# Patient Record
Sex: Male | Born: 1975 | Race: White | Hispanic: No | Marital: Single | Smoking: Current every day smoker
Health system: Southern US, Community
[De-identification: ages and names within clinical notes are randomized; demographics above are authoritative.]

## PROBLEM LIST (undated history)

## (undated) HISTORY — PX: APPENDECTOMY: SHX54

---

## 2004-04-20 ENCOUNTER — Emergency Department (HOSPITAL_COMMUNITY): Admission: EM | Admit: 2004-04-20 | Discharge: 2004-04-20 | Payer: Self-pay | Admitting: Emergency Medicine

## 2007-02-05 ENCOUNTER — Emergency Department (HOSPITAL_COMMUNITY): Admission: EM | Admit: 2007-02-05 | Discharge: 2007-02-05 | Payer: Self-pay | Admitting: *Deleted

## 2007-02-10 ENCOUNTER — Emergency Department (HOSPITAL_COMMUNITY): Admission: EM | Admit: 2007-02-10 | Discharge: 2007-02-10 | Payer: Self-pay | Admitting: Emergency Medicine

## 2011-05-18 ENCOUNTER — Emergency Department (HOSPITAL_COMMUNITY)
Admission: EM | Admit: 2011-05-18 | Discharge: 2011-05-18 | Disposition: A | Discharge status: Home / Self Care | Payer: Self-pay | Attending: Emergency Medicine | Admitting: Emergency Medicine

## 2011-05-18 DIAGNOSIS — F17200 Nicotine dependence, unspecified, uncomplicated: Secondary | ICD-10-CM | POA: Insufficient documentation

## 2011-05-18 DIAGNOSIS — M62838 Other muscle spasm: Secondary | ICD-10-CM | POA: Insufficient documentation

## 2011-05-18 DIAGNOSIS — M549 Dorsalgia, unspecified: Secondary | ICD-10-CM | POA: Insufficient documentation

## 2012-07-22 ENCOUNTER — Encounter (HOSPITAL_COMMUNITY): Payer: Self-pay | Admitting: Emergency Medicine

## 2012-07-22 ENCOUNTER — Emergency Department (HOSPITAL_COMMUNITY): Payer: Self-pay

## 2012-07-22 ENCOUNTER — Emergency Department (HOSPITAL_COMMUNITY)
Admission: EM | Admit: 2012-07-22 | Discharge: 2012-07-22 | Disposition: A | Discharge status: Home / Self Care | Payer: Self-pay | Attending: Emergency Medicine | Admitting: Emergency Medicine

## 2012-07-22 DIAGNOSIS — X500XXA Overexertion from strenuous movement or load, initial encounter: Secondary | ICD-10-CM | POA: Insufficient documentation

## 2012-07-22 DIAGNOSIS — F17200 Nicotine dependence, unspecified, uncomplicated: Secondary | ICD-10-CM | POA: Insufficient documentation

## 2012-07-22 DIAGNOSIS — S93401A Sprain of unspecified ligament of right ankle, initial encounter: Secondary | ICD-10-CM

## 2012-07-22 DIAGNOSIS — S93409A Sprain of unspecified ligament of unspecified ankle, initial encounter: Secondary | ICD-10-CM | POA: Insufficient documentation

## 2012-07-22 MED ORDER — HYDROCODONE-ACETAMINOPHEN 5-325 MG PO TABS: 1.0000 | ORAL_TABLET | ORAL | Status: DC | PRN

## 2012-07-22 NOTE — ED Provider Notes (Signed)
History     CSN: [id number]  Arrival date & time 07/22/12  1710   First MD Initiated Contact with Patient 07/22/12 2002      Chief Complaint  Patient presents with  . Ankle Pain    (Consider location/radiation/quality/duration/timing/severity/associated sxs/prior treatment) HPI History provided by pt.   Pt stepped on a pole with his right foot 3 days ago and rolled his ankle, causing him to fall to the ground.  Did not hit head and denies neck/back pain or pain anywhere other than right ankle.  Is able to bear weight but aggravates pain.  Associated w/ ecchymosis as well as tingling of foot.  Has been icing, elevating, wearing a brace, using crutches and taking ibuprofen but continues to have pain.  He would just like to know if it is broken.    History reviewed. No pertinent past medical history.  Past Surgical History  Procedure Date  . Appendectomy     No family history on file.  History  Substance Use Topics  . Smoking status: Current Every Day Smoker -- 1.0 packs/day    Types: Cigarettes  . Smokeless tobacco: Not on file  . Alcohol Use: No      Review of Systems  All other systems reviewed and are negative.    Allergies  Review of patient's allergies indicates no known allergies.  Home Medications   Current Outpatient Rx  Name Route Sig Dispense Refill  . IBUPROFEN 200 MG PO TABS Oral Take 600 mg by mouth every 6 (six) hours as needed. For pain.    Marland Kitchen HYDROCODONE-ACETAMINOPHEN 5-325 MG PO TABS Oral Take 1 tablet by mouth every 4 (four) hours as needed for pain. 8 tablet 0    BP 127/78  Pulse 83  Temp 98.8 F (37.1 C) (Oral)  Resp 20  SpO2 99%  Physical Exam  Nursing note and vitals reviewed. Constitutional: He is oriented to person, place, and time. He appears well-developed and well-nourished. No distress.  HENT:  Head: Normocephalic and atraumatic.  Eyes:       Normal appearance  Neck: Normal range of motion.  Pulmonary/Chest: Effort normal.    Musculoskeletal: Normal range of motion.       Ecchymosis and minimal edema inferior to lateral malleolus.  Mild tenderness both medial and lateral malleolus, but particularly over lateral ligaments.  Mild pain w/ passive ROM of ankle.  2+ DP pulse and distal sensation intact.    Neurological: He is alert and oriented to person, place, and time.  Psychiatric: He has a normal mood and affect. His behavior is normal.    ED Course  Procedures (including critical care time)  Labs Reviewed - No data to display Dg Ankle Complete Right  07/22/2012  *RADIOLOGY REPORT*  Clinical Data: Right ankle pain, rolled ankle, bruising  RIGHT ANKLE - COMPLETE 3+ VIEW  Comparison: None.  Findings: Three views of the right ankle submitted.  No acute fracture or subluxation.  No radiopaque foreign body.  Ankle mortise is preserved.  IMPRESSION: No acute fracture or subluxation.   Original Report Authenticated By: Natasha Mead, M.D.      1. Sprain of right ankle       MDM  36yo healthy M had an inversion injury to right ankle 3 days ago.  Ecchymosis and tenderness most prominent inferior to lateral malleolus on exam.  Xray neg for fx/dislocation.  Will continue to treat symptomatically for sprain.  Prescribed 8 hydrocodone.  He already has crutches and ASO.  Referred to ortho for persistent pain and/or joint laxity.  8:16 PM        Otilio Miu, [place] 07/22/12 2016

## 2012-07-22 NOTE — ED Notes (Addendum)
Patient c/o right ankle pain after injuring right ankle on Tuesday.  CMS intact. Patient denies any other issues.

## 2012-07-23 NOTE — ED Provider Notes (Signed)
Medical screening examination/treatment/procedure(s) were performed by non-physician practitioner and as supervising physician I was immediately available for consultation/collaboration.  Brayden Brodhead R. Johnathan Heskett, MD 07/23/12 0055 

## 2012-09-19 ENCOUNTER — Emergency Department (HOSPITAL_COMMUNITY)
Admission: EM | Admit: 2012-09-19 | Discharge: 2012-09-19 | Disposition: A | Discharge status: Home / Self Care | Payer: Self-pay | Attending: Emergency Medicine | Admitting: Emergency Medicine

## 2012-09-19 ENCOUNTER — Emergency Department (HOSPITAL_COMMUNITY): Payer: Self-pay

## 2012-09-19 ENCOUNTER — Encounter (HOSPITAL_COMMUNITY): Payer: Self-pay | Admitting: Emergency Medicine

## 2012-09-19 DIAGNOSIS — R070 Pain in throat: Secondary | ICD-10-CM | POA: Insufficient documentation

## 2012-09-19 DIAGNOSIS — R1310 Dysphagia, unspecified: Secondary | ICD-10-CM | POA: Insufficient documentation

## 2012-09-19 DIAGNOSIS — F17200 Nicotine dependence, unspecified, uncomplicated: Secondary | ICD-10-CM | POA: Insufficient documentation

## 2012-09-19 MED ORDER — LIDOCAINE VISCOUS 2 % MT SOLN: 20.0000 mL | OROMUCOSAL | Status: DC | PRN

## 2012-09-19 NOTE — ED Notes (Signed)
Patient transported to X-ray 

## 2012-09-19 NOTE — ED Notes (Signed)
Last night he was eating and felt like he had something  Stuck in throat had a fever last night and has not been able to eat but can drink. Pt breathing well handling [place] well no drooling

## 2012-09-19 NOTE — ED Provider Notes (Signed)
History     CSN: [id number]  Arrival date & time 09/19/12  1422   First MD Initiated Contact with Patient 09/19/12 1519      No chief complaint on file.   (Consider location/radiation/quality/duration/timing/severity/associated sxs/prior treatment) HPI Comments: 36 year old male presents to the emergency department complaining of something stuck in his throat after eating dinner last night. He was eating cubed steak with gravy and mashed potatoes when he felt a "hot sharp" feeling as he was swallowing. He has not been able to eat anything since, however he is able to drink. Denies pain when he doesn't fall, pain increasing to 10 out of 10 with swallowing. Denies difficulty breathing or choking.  The history is provided by the patient.    History reviewed. No pertinent past medical history.  Past Surgical History  Procedure Date  . Appendectomy     No family history on file.  History  Substance Use Topics  . Smoking status: Current Every Day Smoker -- 1.0 packs/day    Types: Cigarettes  . Smokeless tobacco: Not on file  . Alcohol Use: No      Review of Systems  Constitutional: Negative for activity change.  HENT: Positive for sore throat and trouble swallowing. Negative for voice change.   Respiratory: Negative for apnea, choking and shortness of breath.   Neurological: Negative for speech difficulty.    Allergies  Review of patient's allergies indicates no known allergies.  Home Medications   Current Outpatient Rx  Name  Route  Sig  Dispense  Refill  . VITAMIN C 1000 MG PO TABS   Oral   Take 1,000 mg by mouth daily.           BP 121/71  Pulse 83  Temp 98.4 F (36.9 C)  Resp 16  SpO2 100%  Physical Exam  Constitutional: He is oriented to person, place, and time. He appears well-developed and well-nourished. No distress.  HENT:  Head: Normocephalic and atraumatic.  Mouth/Throat: Uvula is midline. Posterior oropharyngeal erythema present. No  posterior oropharyngeal edema.  Eyes: Conjunctivae normal and EOM are normal. Pupils are equal, round, and reactive to light.  Neck: Normal range of motion and phonation normal. Neck supple. No tracheal deviation present.    Cardiovascular: Normal rate, regular rhythm and normal heart sounds.   Pulmonary/Chest: Effort normal and breath sounds normal. No stridor. No respiratory distress.  Abdominal: Soft. Bowel sounds are normal.  Musculoskeletal: Normal range of motion. He exhibits no edema.  Neurological: He is alert and oriented to person, place, and time.  Skin: Skin is warm and dry.  Psychiatric: He has a normal mood and affect. His behavior is normal.    ED Course  Procedures (including critical care time)  Labs Reviewed - No data to display Dg Neck Soft Tissue  09/19/2012  *RADIOLOGY REPORT*  Clinical Data: Sensation of foreign body in the throat.  NECK SOFT TISSUES - 1+ VIEW  Comparison: None.  Findings: There is some fullness posterior to the larynx.  No radiopaque foreign body is evident.  The soft tissues are otherwise unremarkable.  The airway is patent.  There is straightening of the normal cervical lordosis. The lung apices are clear.  IMPRESSION:  1.  Slight fullness posterior to the larynx without any radiopaque foreign body. 2.  The airway is preserved.   Original Report Authenticated By: Marin Roberts, M.D.    Dg Esophagus  09/19/2012  *RADIOLOGY REPORT*  Clinical Data: 36 year old male with pain and difficulty swallowing  solids following the medial left night.  Able to swallow liquids. Possible food impaction.  ESOPHOGRAM/BARIUM SWALLOW  Technique:  Single contrast examination was performed using thin and thick barium plus a barium tablet.  Fluoroscopy time:  2.0 minutes.  Comparison:  Neck radiograph from the same day.  Findings:  The patient was able to swallow thin and thick barium without difficulty.  No obstruction to the forward flow of barium throughout the  cervical and thoracic esophagus and into the stomach.  Prompt gastric emptying to the duodenum.  No foreign body or impacted material identified within the esophagus.  There is smooth narrowing of the esophagus at the level of the aortic impression (series 2, series 14, series 15).  A 12.5 mm barium tablet was briefly lodged in this area.  The patient was mildly symptomatic.  The tablet passed with additional swallows of barium (series 23 - 29). There is subtle mucosal contour irregularity of the barium at the level (series 17, 18).  There is no contrast extravasation.  IMPRESSION: No lodged or impacted material identified within the esophagus. Smooth narrowing of the thoracic esophagus at the level of the aortic impression where there is mild mucosal irregularity.  The 12.5 mm barium tablet was arrested here for a short time (passed with additional swallows of barium). I suspect this represents focal mucosal injury with edema/swelling and subsequent narrowing of the lumen.  If the patient's symptoms do not resolve, recommend direct visualization.   Original Report Authenticated By: Erskine Speed, M.D.      1. Painful swallowing       MDM  36 y/o male with dysphasia. Patient able to drink but not eat. [facility] recommended barium swallow which showed smooth narrowing of thoracic esophagus. GI states this should resolve on its own in 2-3 days. Advised viscous lidocaine and lots of fluids. If no improvement in 2-3 days patient will f/u with GI. Patient states his understanding of plan and is agreeable.        Trevor Mace, PA-C 09/19/12 1924

## 2012-09-19 NOTE — ED Notes (Signed)
Discharged home.  Patient voiced understanding of foods to eat, drinks, etc

## 2012-09-19 NOTE — ED Provider Notes (Addendum)
Chief complaint : Feels something stuck in throat  Feels something is stuck in his throat (points to the sternal notch)since he ate cube steak mashed potatoes and gravy last night. He is able to drink however he states he tried to swallow pill and could not. He denies shortness of breath. The pain feels sharp as if it were a bone or metal object however he states he did not eat any bones. Patient alert no distress handling secretions well  Doug Sou, MD 09/19/12 1610  Doug Sou, MD 09/23/12 [phone]

## 2012-09-23 NOTE — ED Provider Notes (Signed)
Medical screening examination/treatment/procedure(s) were conducted as a shared visit with non-physician practitioner(s) and myself.  I personally evaluated the patient during the encounter  Doug Sou, MD 09/23/12 [phone]

## 2013-02-06 ENCOUNTER — Emergency Department (HOSPITAL_COMMUNITY)
Admission: EM | Admit: 2013-02-06 | Discharge: 2013-02-06 | Disposition: A | Discharge status: Home / Self Care | Payer: Self-pay | Attending: Emergency Medicine | Admitting: Emergency Medicine

## 2013-02-06 ENCOUNTER — Encounter (HOSPITAL_COMMUNITY): Payer: Self-pay | Admitting: Cardiology

## 2013-02-06 ENCOUNTER — Emergency Department (HOSPITAL_COMMUNITY): Payer: Self-pay

## 2013-02-06 DIAGNOSIS — S8990XA Unspecified injury of unspecified lower leg, initial encounter: Secondary | ICD-10-CM | POA: Insufficient documentation

## 2013-02-06 DIAGNOSIS — IMO0002 Reserved for concepts with insufficient information to code with codable children: Secondary | ICD-10-CM | POA: Insufficient documentation

## 2013-02-06 DIAGNOSIS — F17200 Nicotine dependence, unspecified, uncomplicated: Secondary | ICD-10-CM | POA: Insufficient documentation

## 2013-02-06 DIAGNOSIS — Y9301 Activity, walking, marching and hiking: Secondary | ICD-10-CM | POA: Insufficient documentation

## 2013-02-06 DIAGNOSIS — M79671 Pain in right foot: Secondary | ICD-10-CM

## 2013-02-06 DIAGNOSIS — Z79899 Other long term (current) drug therapy: Secondary | ICD-10-CM | POA: Insufficient documentation

## 2013-02-06 DIAGNOSIS — Z9889 Other specified postprocedural states: Secondary | ICD-10-CM | POA: Insufficient documentation

## 2013-02-06 DIAGNOSIS — Z8739 Personal history of other diseases of the musculoskeletal system and connective tissue: Secondary | ICD-10-CM | POA: Insufficient documentation

## 2013-02-06 DIAGNOSIS — Y92009 Unspecified place in unspecified non-institutional (private) residence as the place of occurrence of the external cause: Secondary | ICD-10-CM | POA: Insufficient documentation

## 2013-02-06 MED ORDER — HYDROCODONE-ACETAMINOPHEN 5-325 MG PO TABS: 1.0000 | ORAL_TABLET | Freq: Three times a day (TID) | ORAL | Status: AC | PRN

## 2013-02-06 NOTE — ED Notes (Signed)
Pt reports he hit his foot walking through his house yesterday. Reports pain to the right foot and swelling.

## 2013-02-06 NOTE — ED Provider Notes (Signed)
History  This chart was scribed for Gerhard Munch, MD by Ardeen Jourdain, ED Scribe. This patient was seen in room [facility] and the patient's care was started at 2100.  CSN: [id number]  Arrival date & time 02/06/13  1825   None     Chief Complaint  Patient presents with  . Foot Pain     The history is provided by the patient. No language interpreter was used.    Collin Stone is a 37 y.o. male who presents to the Emergency Department complaining of sudden onset, gradually worsening, constant right foot pain with associated swelling. Pt states he thinks hit his right foot when he was walking through his house yesterday. He states he has a h/o bursitis and ankle surgery. He states his ankle pain has been aggravated over the past few days as well. He locates the pain to the right side of his right foot. He denies any weakness, numbness, tingling and back pain as associated symptoms. Pt is able to ambulate.    History reviewed. No pertinent past medical history.  Past Surgical History  Procedure Laterality Date  . Appendectomy      No family history on file.  History  Substance Use Topics  . Smoking status: Current Every Day Smoker -- 1.00 packs/day    Types: Cigarettes  . Smokeless tobacco: Not on file  . Alcohol Use: Yes      Review of Systems  Constitutional:       Per HPI, otherwise negative  HENT:       Per HPI, otherwise negative  Respiratory:       Per HPI, otherwise negative  Cardiovascular:       Per HPI, otherwise negative  Gastrointestinal: Negative for vomiting.  Endocrine:       Negative aside from HPI  Genitourinary:       Neg aside from HPI   Musculoskeletal:       Per HPI, otherwise negative  Skin: Negative.   Neurological: Negative for syncope.  All other systems reviewed and are negative.    Allergies  Review of patient's allergies indicates no known allergies.  Home Medications   Current Outpatient Rx  Name  Route  Sig  Dispense   Refill  . Ascorbic Acid (VITAMIN C) 1000 MG tablet   Oral   Take 1,000 mg by mouth daily.         . Cyclobenzaprine HCl (FLEXERIL PO)   Oral   Take 1 tablet by mouth once.         Marland Kitchen HYDROcodone-acetaminophen (NORCO) 10-325 MG per tablet   Oral   Take 1 tablet by mouth once.           Triage Vitals: BP 111/68  Pulse 70  Temp(Src) 98.4 F (36.9 C) (Oral)  Resp 12  SpO2 98%  Physical Exam  Nursing note and vitals reviewed. Constitutional: He is oriented to person, place, and time. He appears well-developed. No distress.  HENT:  Head: Normocephalic and atraumatic.  Eyes: Conjunctivae and EOM are normal.  Cardiovascular: Normal rate and regular rhythm.   Pulmonary/Chest: Effort normal. No stridor. No respiratory distress.  Abdominal: He exhibits no distension.  Musculoskeletal: He exhibits no edema.  No medial/lateral ankle pain. Negative thompson's sign  Neurological: He is alert and oriented to person, place, and time.  Skin: Skin is warm and dry.  Psychiatric: He has a normal mood and affect.    ED Course  Procedures (including critical care time)  9:17  PM-Discussed treatment plan which includes x-ray of the right foot, pain medication and home care with pt at bedside and pt agreed to plan.    Labs Reviewed - No data to display Dg Foot Complete Right  02/06/2013  *RADIOLOGY REPORT*  Clinical Data:  Lateral mid foot pain after kicking a wall  RIGHT FOOT COMPLETE - 3+ VIEW  Comparison: Right ankle radiographs - 07/22/2012  Findings:  No fracture or dislocation.  Joint spaces are preserved.  No definite erosions.  Incidental note is made of an os peroneus and os trigonum.  Regional soft tissues are normal.  No radiopaque foreign body.  IMPRESSION:  No fracture or dislocation.   Original Report Authenticated By: Tacey Ruiz, MD    is reviewed and interpreted the patient's x-ray.   No diagnosis found.    MDM   I personally performed the services described in  this documentation, which was scribed in my presence. The recorded information has been reviewed and is accurate.  The patient presents after minor trauma to his foot.  He is neurologically intact, with no evidence of fracture on x-ray.  I reviewed the patient's [facility] prescription drug database information, though no recent prescriptions. He was  discharged with crutches, analgesics and orthopedics follow up.   Gerhard Munch, MD 02/06/13 2220

## 2013-09-28 IMAGING — CR DG FOOT COMPLETE 3+V*R*
3 series · 3 of 3 positions shown · non-contrast
Comparison: Right ankle radiographs - 07/22/2012

CLINICAL DATA: Lateral mid foot pain after kicking a wall

RIGHT FOOT COMPLETE - 3+ VIEW

[t foot ap right]
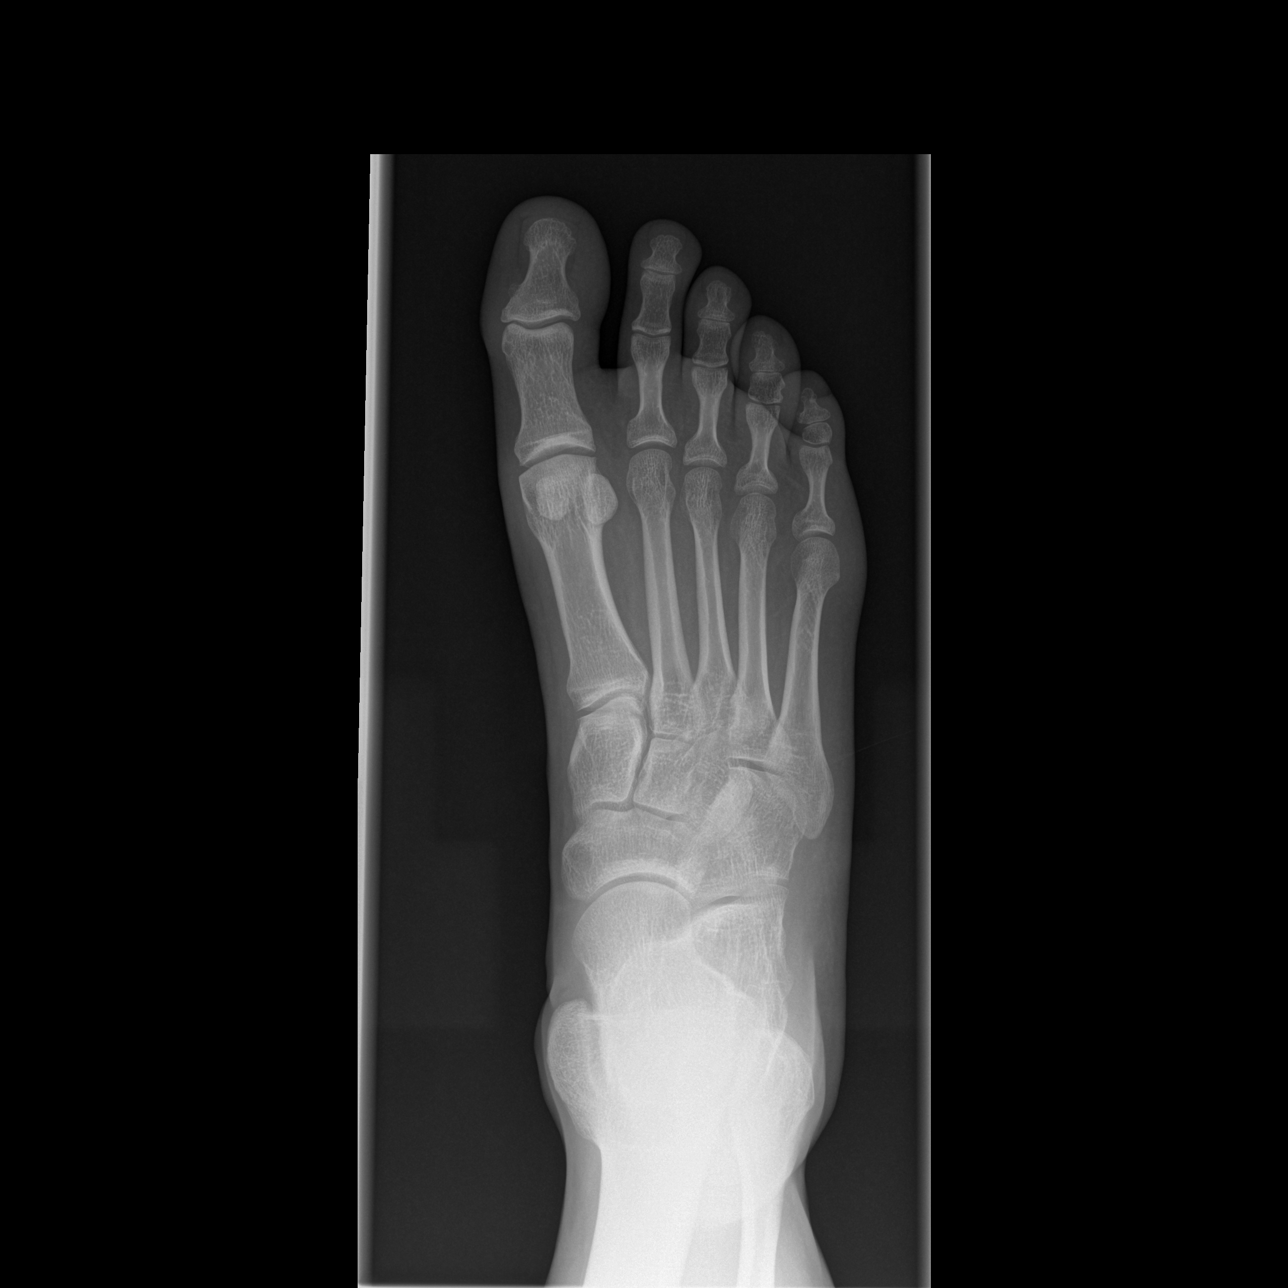

[t foot oblique right]
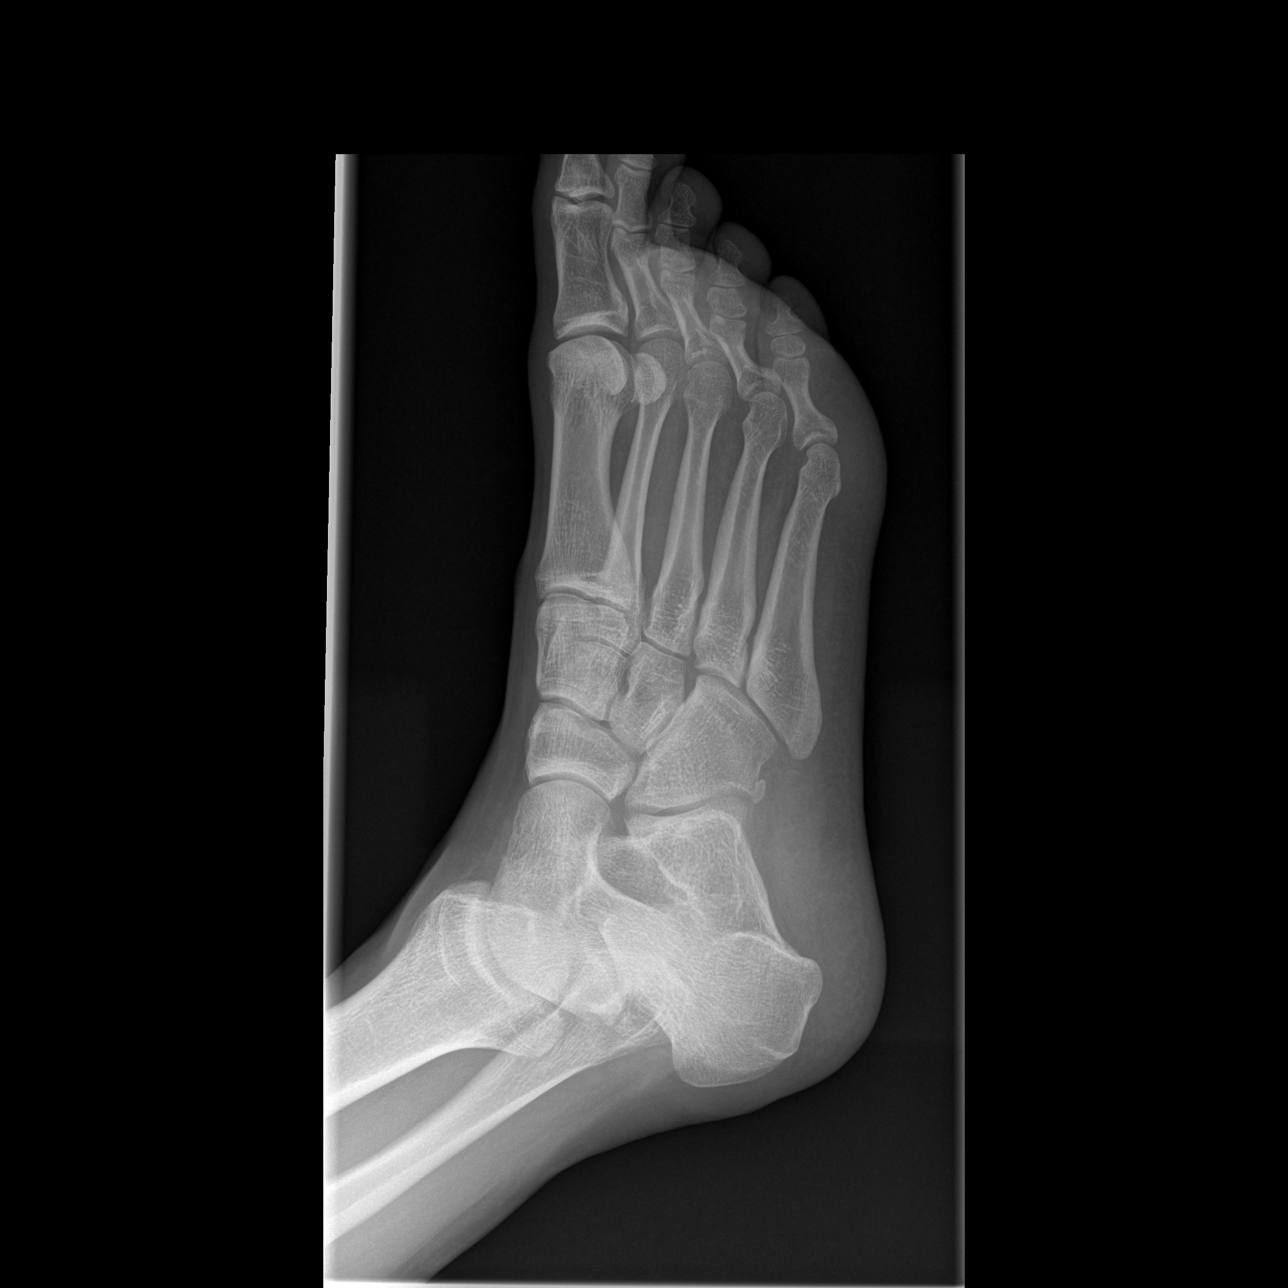

[t foot lat right]
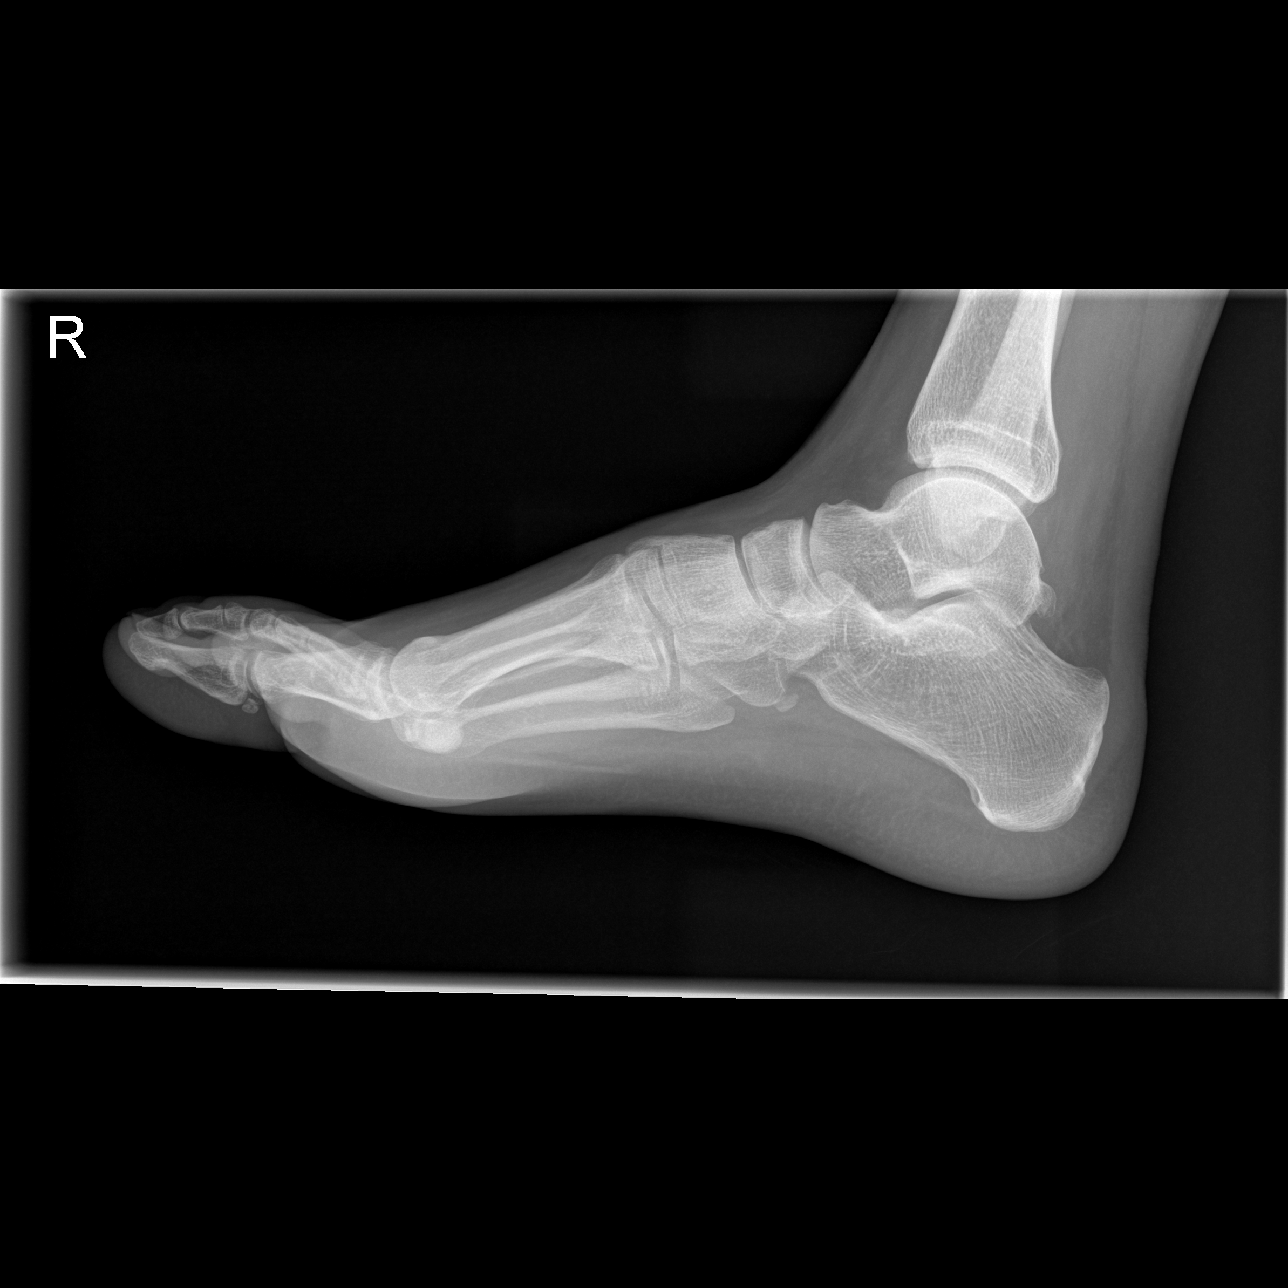

[3 of 3 positions shown; findings below may reference images not displayed]

FINDINGS: No fracture or dislocation.  Joint spaces are preserved.  No
definite erosions.  Incidental note is made of an os peroneus and
os trigonum.  Regional soft tissues are normal.  No radiopaque
foreign body.
IMPRESSION: No fracture or dislocation.

## 2014-03-03 ENCOUNTER — Emergency Department (HOSPITAL_COMMUNITY)
Admission: EM | Admit: 2014-03-03 | Discharge: 2014-03-03 | Disposition: A | Discharge status: Home / Self Care | Payer: Self-pay | Attending: Emergency Medicine | Admitting: Emergency Medicine

## 2014-03-03 ENCOUNTER — Encounter (HOSPITAL_COMMUNITY): Payer: Self-pay | Admitting: Emergency Medicine

## 2014-03-03 DIAGNOSIS — F17200 Nicotine dependence, unspecified, uncomplicated: Secondary | ICD-10-CM | POA: Insufficient documentation

## 2014-03-03 DIAGNOSIS — L255 Unspecified contact dermatitis due to plants, except food: Secondary | ICD-10-CM | POA: Insufficient documentation

## 2014-03-03 DIAGNOSIS — Z79899 Other long term (current) drug therapy: Secondary | ICD-10-CM | POA: Insufficient documentation

## 2014-03-03 DIAGNOSIS — IMO0002 Reserved for concepts with insufficient information to code with codable children: Secondary | ICD-10-CM | POA: Insufficient documentation

## 2014-03-03 MED ORDER — PREDNISONE 20 MG PO TABS: 40.0000 mg | ORAL_TABLET | Freq: Every day | ORAL | Status: AC

## 2014-03-03 MED ORDER — HYDROCORTISONE 2.5 % EX CREA: TOPICAL_CREAM | Freq: Two times a day (BID) | CUTANEOUS | Status: AC

## 2014-03-03 NOTE — ED Notes (Signed)
Pt c/o rash to bilateral arms and legs.  Thinks it might be poison oak because he was cleaning off a fence.  Rash x 3 days.

## 2014-03-03 NOTE — ED Provider Notes (Signed)
CSN: [id number]     Arrival date & time 03/03/14  1550 History  This chart was scribed for Junius Finner, PA-C  working with Dagmar Hait, MD by Ashley Jacobs, ED scribe. This patient was seen in room WTR9/WTR9 and the patient's care was started at 4:14 PM.  First MD Initiated Contact with Patient 03/03/14 1603     Chief Complaint  Patient presents with  . Rash     (Consider location/radiation/quality/duration/timing/severity/associated sxs/prior Treatment) Patient is a 38 y.o. male presenting with rash. The history is provided by the patient and medical records. No language interpreter was used.  Rash Location:  Leg, shoulder/arm and torso Shoulder/arm rash location:  L arm and R arm Leg rash location:  L leg and R leg Severity:  Moderate Onset quality:  Sudden Duration:  2 days Timing:  Constant Progression:  Spreading Chronicity:  New Context: plant contact   Relieved by:  Nothing Worsened by:  Nothing tried Ineffective treatments:  None tried Associated symptoms: no fever, no nausea and not vomiting    HPI Comments: Collin Stone is a 38 y.o. male who presents to the Emergency Department complaining of rash to her bilateral arms, chest and legs, onset three days. Pt thinks he may came into contact with poison oak while cleaning off a fence. The site itches and is worse at night. He tried OTC hydrocortisone which did not seem to help.  Pt had prior contatct poison oak and mentions that his symptoms are similar in nature. Denies fever, nausea and vomiting.  History reviewed. No pertinent past medical history. Past Surgical History  Procedure Laterality Date  . Appendectomy     History reviewed. No pertinent family history. History  Substance Use Topics  . Smoking status: Current Every Day Smoker -- 1.00 packs/day    Types: Cigarettes  . Smokeless tobacco: Not on file  . Alcohol Use: Yes    Review of Systems  Constitutional: Negative for fever.   Gastrointestinal: Negative for nausea and vomiting.  Skin: Positive for color change and rash.  All other systems reviewed and are negative.     Allergies  Review of patient's allergies indicates no known allergies.  Home Medications   Prior to Admission medications   Medication Sig Start Date End Date Taking? Authorizing Provider  Ascorbic Acid (VITAMIN C) 1000 MG tablet Take 1,000 mg by mouth daily.    Historical Provider, MD  Cyclobenzaprine HCl (FLEXERIL PO) Take 1 tablet by mouth once.    Historical Provider, MD  HYDROcodone-acetaminophen (NORCO) 10-325 MG per tablet Take 1 tablet by mouth once.    Historical Provider, MD  HYDROcodone-acetaminophen (NORCO/VICODIN) 5-325 MG per tablet Take 1 tablet by mouth every 8 (eight) hours as needed for pain. 02/06/13   Gerhard Munch, MD  hydrocortisone 2.5 % cream Apply topically 2 (two) times daily. 03/03/14   Junius Finner, PA-C  predniSONE (DELTASONE) 20 MG tablet Take 2 tablets (40 mg total) by mouth daily. 03/03/14   Junius Finner, PA-C   BP 139/78  Pulse 89  Temp(Src) 98.3 F (36.8 C) (Oral)  Resp 18  SpO2 100% Physical Exam  Nursing note and vitals reviewed. Constitutional: He is oriented to person, place, and time. He appears well-developed and well-nourished.  HENT:  Head: Normocephalic and atraumatic.  Eyes: EOM are normal.  Neck: Normal range of motion.  Cardiovascular: Normal rate.   Pulmonary/Chest: Effort normal.  Musculoskeletal: Normal range of motion.  Neurological: He is alert and oriented to person, place, and  time.  Skin: Skin is warm and dry. Rash noted. Rash is vesicular. There is erythema.  Diffuse erythematous vesicular rash with excoriation on left forearm, left lower leg, right leg, right arm and torso.  Psychiatric: He has a normal mood and affect. His behavior is normal.    ED Course  Procedures (including critical care time) DIAGNOSTIC STUDIES: Oxygen Saturation is 100% on room air, normal by my  interpretation.    COORDINATION OF CARE:  4:17 PM Discussed course of care with pt which includes deltasone 20 mg and hydrocortisone cream . Pt understands and agrees.   Labs Review Labs Reviewed - No data to display  Imaging Review No results found.   EKG Interpretation None      MDM   Final diagnoses:  Contact dermatitis due to plant   Pt presenting with diffuse rash consistent with contact dermatitis.  Will tx with prednisone and hydrocortisone cream. Advised to f/u with PCP as needed. Return precautions provided. Pt verbalized understanding and agreement with tx plan.   I personally performed the services described in this documentation, which was scribed in my presence. The recorded information has been reviewed and is accurate.      Junius Finnerrin O'Malley, PA-C 03/03/14 1635

## 2014-03-03 NOTE — ED Provider Notes (Signed)
Medical screening examination/treatment/procedure(s) were performed by non-physician practitioner and as supervising physician I was immediately available for consultation/collaboration.   EKG Interpretation None        Dagmar Hait, MD 03/03/14 [phone]

## 2014-11-25 ENCOUNTER — Encounter (HOSPITAL_COMMUNITY): Payer: Self-pay | Admitting: *Deleted

## 2014-11-25 ENCOUNTER — Emergency Department (HOSPITAL_COMMUNITY)
Admission: EM | Admit: 2014-11-25 | Discharge: 2014-11-25 | Disposition: A | Discharge status: Home / Self Care | Payer: Self-pay | Attending: Emergency Medicine | Admitting: Emergency Medicine

## 2014-11-25 DIAGNOSIS — S61219A Laceration without foreign body of unspecified finger without damage to nail, initial encounter: Secondary | ICD-10-CM

## 2014-11-25 DIAGNOSIS — Y998 Other external cause status: Secondary | ICD-10-CM | POA: Insufficient documentation

## 2014-11-25 DIAGNOSIS — Y9289 Other specified places as the place of occurrence of the external cause: Secondary | ICD-10-CM | POA: Insufficient documentation

## 2014-11-25 DIAGNOSIS — Z79899 Other long term (current) drug therapy: Secondary | ICD-10-CM | POA: Insufficient documentation

## 2014-11-25 DIAGNOSIS — S61213A Laceration without foreign body of left middle finger without damage to nail, initial encounter: Secondary | ICD-10-CM | POA: Insufficient documentation

## 2014-11-25 DIAGNOSIS — Z720 Tobacco use: Secondary | ICD-10-CM | POA: Insufficient documentation

## 2014-11-25 DIAGNOSIS — Y288XXA Contact with other sharp object, undetermined intent, initial encounter: Secondary | ICD-10-CM | POA: Insufficient documentation

## 2014-11-25 DIAGNOSIS — Y9389 Activity, other specified: Secondary | ICD-10-CM | POA: Insufficient documentation

## 2014-11-25 MED ORDER — SULFAMETHOXAZOLE-TRIMETHOPRIM 800-160 MG PO TABS: 1.0000 | ORAL_TABLET | Freq: Two times a day (BID) | ORAL | Status: AC

## 2014-11-25 MED ORDER — CEPHALEXIN 500 MG PO CAPS: 500.0000 mg | ORAL_CAPSULE | Freq: Four times a day (QID) | ORAL | Status: AC

## 2014-11-25 MED ORDER — LIDOCAINE HCL (PF) 1 % IJ SOLN
10.0000 mL | Freq: Once | INTRAMUSCULAR | Status: AC
  Administered 2014-11-25: 10 mL via INTRADERMAL
  Filled 2014-11-25: qty 10

## 2014-11-25 NOTE — ED Notes (Signed)
Suture cart at bedside 

## 2014-11-25 NOTE — ED Notes (Signed)
The pt has a laceration to the lt middle finger 4 hours ago on a razor blade while working on his car.  Bleeding controlled

## 2014-11-25 NOTE — Discharge Instructions (Signed)
We saw you in the ER for your WOUND. Please read the instructions provided on wound care. Keep the area clean and dry, apply bacitracin ointment daily and take the medications provided. RETURN TO THE ER IF THERE IS INCREASED PAIN, REDNESS, PUS COMING OUT from the wound site.   Laceration Care, Adult A laceration is a cut or lesion that goes through all layers of the skin and into the tissue just beneath the skin. TREATMENT  Some lacerations may not require closure. Some lacerations may not be able to be closed due to an increased risk of infection. It is important to see your caregiver as soon as possible after an injury to minimize the risk of infection and maximize the opportunity for successful closure. If closure is appropriate, pain medicines may be given, if needed. The wound will be cleaned to help prevent infection. Your caregiver will use stitches (sutures), staples, wound glue (adhesive), or skin adhesive strips to repair the laceration. These tools bring the skin edges together to allow for faster healing and a better cosmetic outcome. However, all wounds will heal with a scar. Once the wound has healed, scarring can be minimized by covering the wound with sunscreen during the day for 1 full year. HOME CARE INSTRUCTIONS  For sutures or staples:  Keep the wound clean and dry.  If you were given a bandage (dressing), you should change it at least once a day. Also, change the dressing if it becomes wet or dirty, or as directed by your caregiver.  Wash the wound with soap and water 2 times a day. Rinse the wound off with water to remove all soap. Pat the wound dry with a clean towel.  After cleaning, apply a thin layer of the antibiotic ointment as recommended by your caregiver. This will help prevent infection and keep the dressing from sticking.  You may shower as usual after the first 24 hours. Do not soak the wound in water until the sutures are removed.  Only take over-the-counter  or prescription medicines for pain, discomfort, or fever as directed by your caregiver.  Get your sutures or staples removed as directed by your caregiver. For skin adhesive strips:  Keep the wound clean and dry.  Do not get the skin adhesive strips wet. You may bathe carefully, using caution to keep the wound dry.  If the wound gets wet, pat it dry with a clean towel.  Skin adhesive strips will fall off on their own. You may trim the strips as the wound heals. Do not remove skin adhesive strips that are still stuck to the wound. They will fall off in time. For wound adhesive:  You may briefly wet your wound in the shower or bath. Do not soak or scrub the wound. Do not swim. Avoid periods of heavy perspiration until the skin adhesive has fallen off on its own. After showering or bathing, gently pat the wound dry with a clean towel.  Do not apply liquid medicine, cream medicine, or ointment medicine to your wound while the skin adhesive is in place. This may loosen the film before your wound is healed.  If a dressing is placed over the wound, be careful not to apply tape directly over the skin adhesive. This may cause the adhesive to be pulled off before the wound is healed.  Avoid prolonged exposure to sunlight or tanning lamps while the skin adhesive is in place. Exposure to ultraviolet light in the first year will darken the scar.  The skin adhesive will usually remain in place for 5 to 10 days, then naturally fall off the skin. Do not pick at the adhesive film. You may need a tetanus shot if:  You cannot remember when you had your last tetanus shot.  You have never had a tetanus shot. If you get a tetanus shot, your arm may swell, get red, and feel warm to the touch. This is common and not a problem. If you need a tetanus shot and you choose not to have one, there is a rare chance of getting tetanus. Sickness from tetanus can be serious. SEEK MEDICAL CARE IF:   You have redness,  swelling, or increasing pain in the wound.  You see a red line that goes away from the wound.  You have yellowish-white fluid (pus) coming from the wound.  You have a fever.  You notice a bad smell coming from the wound or dressing.  Your wound breaks open before or after sutures have been removed.  You notice something coming out of the wound such as wood or glass.  Your wound is on your hand or foot and you cannot move a finger or toe. SEEK IMMEDIATE MEDICAL CARE IF:   Your pain is not controlled with prescribed medicine.  You have severe swelling around the wound causing pain and numbness or a change in color in your arm, hand, leg, or foot.  Your wound splits open and starts bleeding.  You have worsening numbness, weakness, or loss of function of any joint around or beyond the wound.  You develop painful lumps near the wound or on the skin anywhere on your body. MAKE SURE YOU:   Understand these instructions.  Will watch your condition.  Will get help right away if you are not doing well or get worse. Document Released: 09/21/2005 Document Revised: 12/14/2011 Document Reviewed: 03/17/2011 [facility] Patient Information 2015 [place], [place]. This information is not intended to replace advice given to you by your health care provider. Make sure you discuss any questions you have with your health care provider.  Stitches, Staples, or Skin Adhesive Strips  Stitches (sutures), staples, and skin adhesive strips hold the skin together as it heals. They will usually be in place for 7 days or less. HOME CARE  Wash your hands with soap and water before and after you touch your wound.  Only take medicine as told by your doctor.  Cover your wound only if your doctor told you to. Otherwise, leave it open to air.  Do not get your stitches wet or dirty. If they get dirty, dab them gently with a clean washcloth. Wet the washcloth with soapy water. Do not rub. Pat them dry  gently.  Do not put medicine or medicated cream on your stitches unless your doctor told you to.  Do not take out your own stitches or staples. Skin adhesive strips will fall off by themselves.  Do not pick at the wound. Picking can cause an infection.  Do not miss your follow-up appointment.  If you have problems or questions, call your doctor. GET HELP RIGHT AWAY IF:   You have a temperature by mouth above 102 F (38.9 C), not controlled by medicine.  You have chills.  You have redness or pain around your stitches.  There is puffiness (swelling) around your stitches.  You notice fluid (drainage) from your stitches.  There is a bad smell coming from your wound. MAKE SURE YOU:  Understand these instructions.  Will  watch your condition.  Will get help if you are not doing well or get worse. Document Released: 07/19/2009 Document Revised: 12/14/2011 Document Reviewed: 07/19/2009 [facility] Patient Information 2015 [place], [place]. This information is not intended to replace advice given to you by your health care provider. Make sure you discuss any questions you have with your health care provider.

## 2014-11-25 NOTE — ED Provider Notes (Signed)
CSN: [id number]     Arrival date & time 11/25/14  0144 History  This chart was scribed for Derwood Kaplan, MD by Abel Presto, ED Scribe. This patient was seen in room A04C/A04C and the patient's care was started at 2:23 AM.     Chief Complaint  Patient presents with  . Laceration    Patient is a 39 y.o. male presenting with skin laceration. The history is provided by the patient. No language interpreter was used.  Laceration   HPI Comments: Collin Stone is a 39 y.o. male who presents to the Emergency Department complaining of laceration to left middle finger 4 hours PTA. Pt notes he was working on his car and cut himself with a razor blade in his hand. Pt is right-handed Pt denies any significant medical Hx. Pt notes last tetanus was 3 years ago. Pt denies any numbness.   History reviewed. No pertinent past medical history. Past Surgical History  Procedure Laterality Date  . Appendectomy     No family history on file. History  Substance Use Topics  . Smoking status: Current Every Day Smoker -- 1.00 packs/day    Types: Cigarettes  . Smokeless tobacco: Not on file  . Alcohol Use: Yes    Review of Systems  Skin: Positive for wound.  Allergic/Immunologic: Negative for immunocompromised state.  Neurological: Negative for numbness.  Hematological: Does not bruise/bleed easily.  All other systems reviewed and are negative.     Allergies  Review of patient's allergies indicates no known allergies.  Home Medications   Prior to Admission medications   Medication Sig Start Date End Date Taking? Authorizing Provider  acetaminophen (TYLENOL) 500 MG tablet Take 1,500 mg by mouth every 6 (six) hours as needed for mild pain.   Yes Historical Provider, MD  guaiFENesin (MUCINEX) 600 MG 12 hr tablet Take 600 mg by mouth 2 (two) times daily as needed for cough or to loosen phlegm.   Yes Historical Provider, MD  Ascorbic Acid (VITAMIN C) 1000 MG tablet Take 1,000 mg by mouth daily.     Historical Provider, MD  cephALEXin (KEFLEX) 500 MG capsule Take 1 capsule (500 mg total) by mouth 4 (four) times daily. 11/25/14   Derwood Kaplan, MD  Cyclobenzaprine HCl (FLEXERIL PO) Take 1 tablet by mouth once.    Historical Provider, MD  HYDROcodone-acetaminophen (NORCO) 10-325 MG per tablet Take 1 tablet by mouth once.    Historical Provider, MD  HYDROcodone-acetaminophen (NORCO/VICODIN) 5-325 MG per tablet Take 1 tablet by mouth every 8 (eight) hours as needed for pain. Patient not taking: Reported on 11/25/2014 02/06/13   Gerhard Munch, MD  hydrocortisone 2.5 % cream Apply topically 2 (two) times daily. Patient not taking: Reported on 11/25/2014 03/03/14   Junius Finner, PA-C  predniSONE (DELTASONE) 20 MG tablet Take 2 tablets (40 mg total) by mouth daily. Patient not taking: Reported on 11/25/2014 03/03/14   Junius Finner, PA-C  sulfamethoxazole-trimethoprim (SEPTRA DS) 800-160 MG per tablet Take 1 tablet by mouth 2 (two) times daily. 11/25/14   Shirl Weir, MD   BP 140/85 mmHg  Pulse 76  Temp(Src) 98.3 F (36.8 C) (Oral)  Resp 19  Ht  (1.778 m)  Wt 143 lb (64.864 kg)  BMI 20.52 kg/m2  SpO2 100% Physical Exam  Constitutional: He is oriented to person, place, and time. He appears well-developed and well-nourished.  HENT:  Head: Normocephalic.  Eyes: Conjunctivae are normal.  Neck: Normal range of motion. Neck supple.  Pulmonary/Chest: Effort normal.  Musculoskeletal: Normal range of motion.       Hands: Neurological: He is alert and oriented to person, place, and time.  Sensory exam is normal.   Skin: Skin is warm and dry.  Psychiatric: He has a normal mood and affect. His behavior is normal.  Nursing note and vitals reviewed.   ED Course  Procedures (including critical care time) DIAGNOSTIC STUDIES: Oxygen Saturation is 100% on room air, normal by my interpretation.    COORDINATION OF CARE: 2:30 AM Discussed treatment plan with patient at beside, the patient  agrees with the plan and has no further questions at this time.   Labs Review Labs Reviewed - No data to display  Imaging Review No results found.   EKG Interpretation None       LACERATION REPAIR Performed by: Derwood KaplanNanavati, Dillan Lunden Authorized by: Derwood KaplanNanavati, Rylyn Ranganathan Consent: Verbal consent obtained. Risks and benefits: risks, benefits and alternatives were discussed Consent given by: patient Patient identity confirmed: provided demographic data Prepped and Draped in normal sterile fashion Wound explored  Laceration Location: L long finger  Laceration Length: 1.5 cm  No Foreign Bodies seen or palpated  Anesthesia: local infiltration  Local anesthetic: lidocaine 1% without epinephrine  Anesthetic total: 3  ml  Irrigation method: syringe Amount of cleaning: standard  Skin closure: primary  Number of sutures: 2, 6-0 Nylons  Technique: simple inturrupted   Patient tolerance: Patient tolerated the procedure well with no immediate complications.  MDM   Final diagnoses:  Finger laceration, initial encounter    I personally performed the services described in this documentation, which was scribed in my presence. The recorded information has been reviewed and is accurate.  Pt comes in with finger lac. Occurred about 3 hours prior to ER arrival. Wound cleaned well post anesthesia. Lac repaired. Return precautions for infection discussed. Will give prophylactic antibiotics given contaminated dirty wound. TDAP is updated.    Derwood KaplanAnkit Lonney Revak, MD 11/25/14 [id number]

## 2017-12-14 ENCOUNTER — Emergency Department (HOSPITAL_COMMUNITY)
Admission: EM | Admit: 2017-12-14 | Discharge: 2017-12-14 | Disposition: A | Discharge status: Home / Self Care | Payer: Self-pay | Attending: Emergency Medicine | Admitting: Emergency Medicine

## 2017-12-14 ENCOUNTER — Encounter (HOSPITAL_COMMUNITY): Payer: Self-pay | Admitting: Emergency Medicine

## 2017-12-14 DIAGNOSIS — F17210 Nicotine dependence, cigarettes, uncomplicated: Secondary | ICD-10-CM | POA: Insufficient documentation

## 2017-12-14 DIAGNOSIS — Y929 Unspecified place or not applicable: Secondary | ICD-10-CM | POA: Insufficient documentation

## 2017-12-14 DIAGNOSIS — Y939 Activity, unspecified: Secondary | ICD-10-CM | POA: Insufficient documentation

## 2017-12-14 DIAGNOSIS — Z79899 Other long term (current) drug therapy: Secondary | ICD-10-CM | POA: Insufficient documentation

## 2017-12-14 DIAGNOSIS — M542 Cervicalgia: Secondary | ICD-10-CM | POA: Insufficient documentation

## 2017-12-14 DIAGNOSIS — Y999 Unspecified external cause status: Secondary | ICD-10-CM | POA: Insufficient documentation

## 2017-12-14 MED ORDER — METHOCARBAMOL 500 MG PO TABS: 500.0000 mg | ORAL_TABLET | Freq: Two times a day (BID) | ORAL | 0 refills | Status: AC
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
## 2017-12-14 NOTE — Discharge Instructions (Signed)
Expect your soreness to increase over the next 2-3 days. Take it easy, but do not lay around too much as this may make any stiffness worse.  °Antiinflammatory medications: Take 600 mg of ibuprofen every 6 hours or 440 mg (over the counter dose) to 500 mg (prescription dose) of naproxen every 12 hours for the next 3 days. After this time, these medications may be used as needed for pain. Take these medications with food to avoid upset stomach. Choose only one of these medications, do not take them together.  °Tylenol: Should you continue to have additional pain while taking the ibuprofen or naproxen, you may add in tylenol as needed. Your daily total maximum amount of tylenol from all sources should be limited to 4000mg/day for persons without liver problems, or 2000mg/day for those with liver problems. °Muscle relaxer: Robaxin is a muscle relaxer and may help loosen stiff muscles. Do not take the Robaxin while driving or performing other dangerous activities.  °Exercises: Be sure to perform the attached exercises starting with three times a week and working up to performing them daily. This is an essential part of preventing long term problems.  ° °Follow up with a primary care provider for any future management of these complaints. °

## 2022-05-04 ENCOUNTER — Ambulatory Visit (HOSPITAL_COMMUNITY)
Admission: EM | Admit: 2022-05-04 | Discharge: 2022-05-04 | Disposition: A | Discharge status: Home / Self Care | Payer: Self-pay | Attending: Physician Assistant | Admitting: Physician Assistant

## 2022-05-04 ENCOUNTER — Encounter (HOSPITAL_COMMUNITY): Payer: Self-pay

## 2022-05-04 DIAGNOSIS — K529 Noninfective gastroenteritis and colitis, unspecified: Secondary | ICD-10-CM

## 2022-05-04 DIAGNOSIS — R112 Nausea with vomiting, unspecified: Secondary | ICD-10-CM

## 2022-05-04 MED ORDER — ONDANSETRON 4 MG PO TBDP
ORAL_TABLET | ORAL | Status: AC
  Filled 2022-05-04: qty 1

## 2022-05-04 MED ORDER — ONDANSETRON HCL 4 MG PO TABS: 4.0000 mg | ORAL_TABLET | Freq: Three times a day (TID) | ORAL | 0 refills | Status: AC | PRN

## 2022-05-04 MED ORDER — ONDANSETRON 4 MG PO TBDP
4.0000 mg | ORAL_TABLET | Freq: Once | ORAL | Status: AC
  Administered 2022-05-04: 4 mg via ORAL

## 2022-05-04 NOTE — ED Triage Notes (Signed)
Pt reports vomiting and nausea x 2 days. Pt reports some loose stools.

## 2022-05-04 NOTE — Discharge Instructions (Addendum)
Advised to continue drinking clear liquids for the next 24 hours. Advised to take the Zofran 1 every 6 hours for nausea and abdominal cramping. Advised to begin a bland diet for the next 48 hours. Advised to follow-up with PCP or return to urgent care if symptoms fail to improve

## 2022-05-04 NOTE — ED Provider Notes (Signed)
[facility]    CSN: [id number] Arrival date & time: 05/04/22  1337      History   Chief Complaint Chief Complaint  Patient presents with   Emesis    HPI Collin Stone is a 46 y.o. male.   46 year old male presents with nausea and vomiting.  Patient indicates that on Friday night he ate some leftover chicken that had been in the refrigerator for several days but he did not exactly know how long.  The chicken had already been cooked.  Patient indicates that Saturday morning he started having some abdominal cramping, nausea, and vomiting.  Patient indicates this continued through Saturday and into Sunday.  Patient indicates Sunday he started feeling better but then started having some loose stools.  Patient indicates this morning he continues with mild nausea and stomach cramping.  Patient indicates he was able to eat some toast and seem to keep it down and is drinking clear liquids.  Patient indicates he has not have any fever, mild intermittent chills.  Patient relates he has not been around any family or friends that have had similar symptoms.   Emesis Associated symptoms: diarrhea     History reviewed. No pertinent past medical history.  There are no problems to display for this patient.   Past Surgical History:  Procedure Laterality Date   APPENDECTOMY         Home Medications    Prior to Admission medications   Medication Sig Start Date End Date Taking? Authorizing Provider  ondansetron (ZOFRAN) 4 MG tablet Take 1 tablet (4 mg total) by mouth every 8 (eight) hours as needed for nausea or vomiting. 05/04/22  Yes Ellsworth Lennox, PA-C  acetaminophen (TYLENOL) 500 MG tablet Take 1,500 mg by mouth every 6 (six) hours as needed for mild pain.    [provider]  Ascorbic Acid (VITAMIN C) 1000 MG tablet Take 1,000 mg by mouth daily.    [provider]  cephALEXin (KEFLEX) 500 MG capsule Take 1 capsule (500 mg total) by mouth 4 (four) times daily.  11/25/14   Derwood Kaplan, MD  Cyclobenzaprine HCl (FLEXERIL PO) Take 1 tablet by mouth once.    [provider]  guaiFENesin (MUCINEX) 600 MG 12 hr tablet Take 600 mg by mouth 2 (two) times daily as needed for cough or to loosen phlegm.    [provider]  HYDROcodone-acetaminophen (NORCO) 10-325 MG per tablet Take 1 tablet by mouth once.    [provider]  HYDROcodone-acetaminophen (NORCO/VICODIN) 5-325 MG per tablet Take 1 tablet by mouth every 8 (eight) hours as needed for pain. Patient not taking: Reported on 11/25/2014 02/06/13   Gerhard Munch, MD  hydrocortisone 2.5 % cream Apply topically 2 (two) times daily. Patient not taking: Reported on 11/25/2014 03/03/14   Lurene Shadow, PA-C  methocarbamol (ROBAXIN) 500 MG tablet Take 1 tablet (500 mg total) by mouth 2 (two) times daily. 12/14/17   Joy, Shawn C, PA-C  predniSONE (DELTASONE) 20 MG tablet Take 2 tablets (40 mg total) by mouth daily. Patient not taking: Reported on 11/25/2014 03/03/14   Lurene Shadow, PA-C  sulfamethoxazole-trimethoprim (SEPTRA DS) 800-160 MG per tablet Take 1 tablet by mouth 2 (two) times daily. 11/25/14   Derwood Kaplan, MD    Family History History reviewed. No pertinent family history.  Social History Social History   Tobacco Use   Smoking status: Every Day    Packs/day: 1.00    Types: Cigarettes  Substance Use Topics  Alcohol use: Yes   Drug use: Yes    Frequency: 1.0 times per week    Types: Marijuana     Allergies   Patient has no known allergies.   Review of Systems Review of Systems  Gastrointestinal:  Positive for diarrhea and vomiting.     Physical Exam Triage Vital Signs ED Triage Vitals [05/04/22 1500]  Enc Vitals Group     BP 116/60     Pulse Rate 81     Resp 18     Temp 98.2 F (36.8 C)     Temp Source Oral     SpO2 98 %     Weight      Height      Head Circumference      Peak Flow      Pain Score      Pain Loc      Pain Edu?      Excl.  in GC?    No data found.  Updated Vital Signs BP 116/60 (BP Location: Left Arm)   Pulse 81   Temp 98.2 F (36.8 C) (Oral)   Resp 18   SpO2 98%   Visual Acuity Right Eye Distance:   Left Eye Distance:   Bilateral Distance:    Right Eye Near:   Left Eye Near:    Bilateral Near:     Physical Exam Constitutional:      Appearance: Normal appearance.  Cardiovascular:     Rate and Rhythm: Normal rate and regular rhythm.     Heart sounds: Normal heart sounds.  Pulmonary:     Effort: Pulmonary effort is normal.     Breath sounds: Normal breath sounds and air entry. No wheezing, rhonchi or rales.  Abdominal:     General: Abdomen is flat. Bowel sounds are normal.     Palpations: Abdomen is soft.     Tenderness: There is no abdominal tenderness. There is no guarding or rebound.  Neurological:     Mental Status: He is alert.      UC Treatments / Results  Labs (all labs ordered are listed, but only abnormal results are displayed) Labs Reviewed - No data to display  EKG   Radiology No results found.  Procedures Procedures (including critical care time)  Medications Ordered in UC Medications  ondansetron (ZOFRAN-ODT) disintegrating tablet 4 mg (4 mg Oral Given 05/04/22 1513)    Initial Impression / Assessment and Plan / UC Course  I have reviewed the triage vital signs and the nursing notes.  Pertinent labs & imaging results that were available during my care of the patient were reviewed by me and considered in my medical decision making (see chart for details).    Plan: 1.  Advised take the Zofran 1 every 6 hours to help reduce the abdominal cramping and nausea. 2.  Advised to continue increasing fluid intake and bland diet for the next 48 hours. 3.  Advised to avoid fried, greasy, spicy foods for the next 2 to 3 days. 4.  Advised to follow-up with PCP or return to urgent care if symptoms fail to improve. Final Clinical Impressions(s) / UC Diagnoses   Final  diagnoses:  Nausea vomiting and diarrhea  Gastroenteritis     Discharge Instructions      Advised to continue drinking clear liquids for the next 24 hours. Advised to take the Zofran 1 every 6 hours for nausea and abdominal cramping. Advised to begin a bland diet for the next 48 hours.  Advised to follow-up with PCP or return to urgent care if symptoms fail to improve    ED Prescriptions     Medication Sig Dispense Auth. Provider   ondansetron (ZOFRAN) 4 MG tablet Take 1 tablet (4 mg total) by mouth every 8 (eight) hours as needed for nausea or vomiting. 20 tablet Ellsworth Lennox, PA-C      PDMP not reviewed this encounter.   Ellsworth Lennox, PA-C 05/04/22 1516

## 2024-05-18 ENCOUNTER — Encounter (HOSPITAL_COMMUNITY): Payer: Self-pay

## 2024-05-18 ENCOUNTER — Emergency Department (HOSPITAL_COMMUNITY)
Admission: EM | Admit: 2024-05-18 | Discharge: 2024-05-18 | Discharge status: Against Medical Advice | Payer: Self-pay | Attending: Emergency Medicine | Admitting: Emergency Medicine

## 2024-05-18 ENCOUNTER — Other Ambulatory Visit: Payer: Self-pay

## 2024-05-18 DIAGNOSIS — J029 Acute pharyngitis, unspecified: Secondary | ICD-10-CM | POA: Insufficient documentation

## 2024-05-18 DIAGNOSIS — R6883 Chills (without fever): Secondary | ICD-10-CM | POA: Insufficient documentation

## 2024-05-18 DIAGNOSIS — Z5321 Procedure and treatment not carried out due to patient leaving prior to being seen by health care provider: Secondary | ICD-10-CM | POA: Insufficient documentation

## 2024-05-18 DIAGNOSIS — H9201 Otalgia, right ear: Secondary | ICD-10-CM | POA: Insufficient documentation

## 2024-05-18 LAB — RESP PANEL BY RT-PCR (RSV, FLU A&B, COVID)  RVPGX2
Influenza A by PCR: NEGATIVE
Influenza B by PCR: NEGATIVE
Resp Syncytial Virus by PCR: NEGATIVE
SARS Coronavirus 2 by RT PCR: NEGATIVE

## 2024-05-18 LAB — MONONUCLEOSIS SCREEN: Mono Screen: NEGATIVE

## 2024-05-18 LAB — GROUP A STREP BY PCR: Group A Strep by PCR: NOT DETECTED

## 2024-05-18 NOTE — ED Notes (Signed)
 Pt not answering to multiple calls for registration

## 2024-05-18 NOTE — ED Provider Triage Note (Signed)
 Emergency Medicine Provider Triage Evaluation Note  Collin Stone , a 48 y.o. male  was evaluated in triage.  Pt complains of sore throat, blisters on tonsils, some right ear pain.  No shortness of breath, no difficulty swallowing.  Pain ongoing for 2 days.  Rates pain 9/10.  Reports some chills at home, no objective fever..  Review of Systems  Positive: Sore throat, ear pain Negative:   Physical Exam  BP (!) 157/90 (BP Location: Right Arm)   Pulse 63   Temp 98.9 F (37.2 C) (Oral)   Resp 18   Ht 5' 10 (1.778 m)   Wt 65.8 kg   SpO2 99%   BMI 20.81 kg/m  Gen:   Awake, no distress   Resp:  Normal effort  MSK:   Moves extremities without difficulty  Other:  Erythema of posterior oropharynx, possible mild right ear effusion with no bulging TM, no redness  Medical Decision Making  Medically screening exam initiated at 5:03 PM.  Appropriate orders placed.  Collin Stone was informed that the remainder of the evaluation will be completed by another provider, this initial triage assessment does not replace that evaluation, and the importance of remaining in the ED until their evaluation is complete.  Workup initiated in triage    Collin Stone DEL, [place] 05/18/24 1705

## 2024-05-18 NOTE — ED Triage Notes (Signed)
 Pt reports sore throat, blisters on tonsils, and enlarged tonsils x2 days. No SHOB. No airway comprimise.
